# Patient Record
Sex: Male | Born: 1970 | Race: Black or African American | Hispanic: No | Marital: Single | Smoking: Former smoker
Health system: Southern US, Community
[De-identification: ages and names within clinical notes are randomized; demographics above are authoritative.]

## PROBLEM LIST (undated history)

## (undated) DIAGNOSIS — E785 Hyperlipidemia, unspecified: Secondary | ICD-10-CM

## (undated) HISTORY — DX: Hyperlipidemia, unspecified: E78.5

## (undated) HISTORY — PX: KNEE SURGERY: SHX244

---

## 2015-05-28 ENCOUNTER — Ambulatory Visit (INDEPENDENT_AMBULATORY_CARE_PROVIDER_SITE_OTHER): Payer: BLUE CROSS/BLUE SHIELD | Admitting: Family Medicine

## 2015-05-28 ENCOUNTER — Ambulatory Visit (INDEPENDENT_AMBULATORY_CARE_PROVIDER_SITE_OTHER): Payer: BLUE CROSS/BLUE SHIELD

## 2015-05-28 VITALS — BP 150/92 | HR 70 | Temp 98.7°F | Resp 16 | Ht 70.0 in | Wt 209.0 lb

## 2015-05-28 DIAGNOSIS — M25561 Pain in right knee: Secondary | ICD-10-CM

## 2015-05-28 DIAGNOSIS — M25461 Effusion, right knee: Secondary | ICD-10-CM | POA: Diagnosis not present

## 2015-05-28 DIAGNOSIS — R938 Abnormal findings on diagnostic imaging of other specified body structures: Secondary | ICD-10-CM

## 2015-05-28 DIAGNOSIS — T148XXA Other injury of unspecified body region, initial encounter: Secondary | ICD-10-CM

## 2015-05-28 DIAGNOSIS — R9389 Abnormal findings on diagnostic imaging of other specified body structures: Secondary | ICD-10-CM

## 2015-05-28 NOTE — Progress Notes (Signed)
Chief Complaint:  Chief Complaint  Patient presents with  . Knee Injury    [name] on right knee yesterday     HPI: Jeff Sullivan is a 45 y.o. male who reports to [facility] today complaining of  Right knee pain, he slipped and fell on it while playing with 44 y/o son. And he heard a noise and then he walked on it , he flet his knee pivoted from side to side and went back into place,  last night it swelled up. No prior injury, no prior surgeries He is able to walk on it with a limp, he was able to lift his leg in a straight leg . Denies n.w.t. He has swelling, it is a painful discomfort but not excruciating.   No past medical history on file. No past surgical history on file. Social History   Social History  . Marital Status: Single    Spouse Name: N/A  . Number of Children: N/A  . Years of Education: N/A   Social History Main Topics  . Smoking status: Never Smoker   . Smokeless tobacco: Not on file  . Alcohol Use: No  . Drug Use: No  . Sexual Activity: Not on file   Other Topics Concern  . Not on file   Social History Narrative  . No narrative on file   No family history on file. No Known Allergies Prior to Admission medications   Not on File     ROS: The patient denies fevers, chills, night sweats, unintentional weight loss, chest pain, palpitations, wheezing, dyspnea on exertion, nausea, vomiting, abdominal pain, dysuria, hematuria, melena  All other systems have been reviewed and were otherwise negative with the exception of those mentioned in the HPI and as above.    PHYSICAL EXAM: Filed Vitals:   05/28/15 1248  BP: 150/92  Pulse: 70  Temp: 98.7 F (37.1 C)  Resp: 16   Body mass index is 29.99 kg/(m^2).   General: Alert, no acute distress HEENT:  Normocephalic, atraumatic, oropharynx patent. EOMI, PERRLA Cardiovascular:  Regular rate and rhythm, no rubs murmurs or gallops.   Respiratory: Clear to auscultation bilaterally.  No wheezes, rales, or  rhonchi.  No cyanosis, no use of accessory musculature Abdominal: No organomegaly, abdomen is soft and non-tender, positive bowel sounds. No masses. Skin: No rashes. Neurologic: Facial musculature symmetric. Psychiatric: Patient acts appropriately throughout our interaction. Lymphatic: No cervical or submandibular lymphadenopathy Musculoskeletal: Gait antalgic.  + right knee pain/edema, tenderness + able to do straight leg  5/5 strength, 2/2 ankle DTR No open wounds Brisk DP and PTA Neg HOman    LABS: No results found for this or any previous visit.   EKG/XRAY:   Primary read interpreted by Dr. Conley Rolls at [facility].   ASSESSMENT/PLAN: Encounter Diagnoses  Name Primary?  . Right knee pain Yes  . Knee swelling, right   . Abnormal x-ray   . Avulsion fracture    Refer to ortho  Either Monday or Tuesday 2/12-2/14 since off Crutches, knee immobilier, patient is here by himself and cannot drive without hinge knee brace so he got both knee immobilizer and hinge knee brace Declined pain meds, will take otc tylenol and also ibuprofen RICE, nonweighbearing recommended FU prn  Patient was given xray--and report , see below:   FINDINGS: There is a linear approximately 0.7 cm ossicle adjacent to the lateral tibial plateau which is favored to represent an avulsion injury (Segond fracture) which may be seen  in the setting of an ACL injury. Suspected moderate-sized joint effusion. No evidence of lipohemarthrosis. No additional fracture. No dislocation. Joint spaces are preserved. Regional soft tissues appear normal.  IMPRESSION: 1. Linear ossicle structure adjacent to the lateral tibial plateau favored to represent a Segond fracture which may be seen in the setting of ACL injury. Clinical correlation is advised. Further evaluation with knee MRI could be performed as clinically indicated. 2. Moderate-sized joint effusion. No evidence of lipohemarthrosis.   Electronically Signed  By:  Simonne Come M.D.  On: 05/28/2015 14:12 Gross sideeffects, risk and benefits, and alternatives of medications d/w patient. Patient is aware that all medications have potential sideeffects and we are unable to predict every sideeffect or drug-drug interaction that may occur.  Biruk Troia DO  05/28/2015 2:21 PM

## 2015-05-28 NOTE — Patient Instructions (Signed)
Because you received an x-ray today, you will receive an invoice from [facility]. Please contact [facility] at [phone] with questions or concerns regarding your invoice. Our billing staff will not be able to assist you with those questions. °

## 2015-05-29 ENCOUNTER — Encounter: Payer: Self-pay | Admitting: Family Medicine

## 2015-06-01 ENCOUNTER — Telehealth: Payer: Self-pay

## 2015-06-01 NOTE — Telephone Encounter (Signed)
Patient wants an OOW - for  Feb  16, 19 & 20th   Dr  Conley Rolls   [phone]

## 2015-06-02 NOTE — Telephone Encounter (Signed)
Yes, go ahead and write him letter to be out of work.

## 2015-06-02 NOTE — Telephone Encounter (Signed)
Pt called to req. cb from Dr. Conley Rolls... Re. RTW letter... Informed pt of last PCP notes// he stated he will come to pick it up today  [phone]

## 2015-06-02 NOTE — Telephone Encounter (Signed)
Okay to write letter 

## 2015-06-04 NOTE — Telephone Encounter (Signed)
Note written and given to pt. °

## 2017-07-28 ENCOUNTER — Telehealth: Payer: Self-pay

## 2017-07-28 NOTE — Telephone Encounter (Signed)
NOTES ON FILE 

## 2017-08-15 DIAGNOSIS — R9431 Abnormal electrocardiogram [ECG] [EKG]: Secondary | ICD-10-CM

## 2017-08-15 DIAGNOSIS — I10 Essential (primary) hypertension: Secondary | ICD-10-CM

## 2017-08-15 DIAGNOSIS — R0789 Other chest pain: Secondary | ICD-10-CM

## 2017-08-19 DIAGNOSIS — R079 Chest pain, unspecified: Secondary | ICD-10-CM | POA: Insufficient documentation

## 2017-08-19 DIAGNOSIS — R9431 Abnormal electrocardiogram [ECG] [EKG]: Secondary | ICD-10-CM | POA: Insufficient documentation

## 2017-08-19 DIAGNOSIS — I10 Essential (primary) hypertension: Secondary | ICD-10-CM | POA: Insufficient documentation

## 2017-08-20 ENCOUNTER — Ambulatory Visit: Payer: Self-pay | Admitting: Cardiology

## 2017-09-04 IMAGING — CR DG KNEE COMPLETE 4+V*R*
5 series · 5 of 5 positions shown · non-contrast
Comparison: None.

CLINICAL DATA: Pain and swelling after slipping and falling.
Initial encounter.

EXAM:
RIGHT KNEE - COMPLETE 4+ VIEW

[AP]
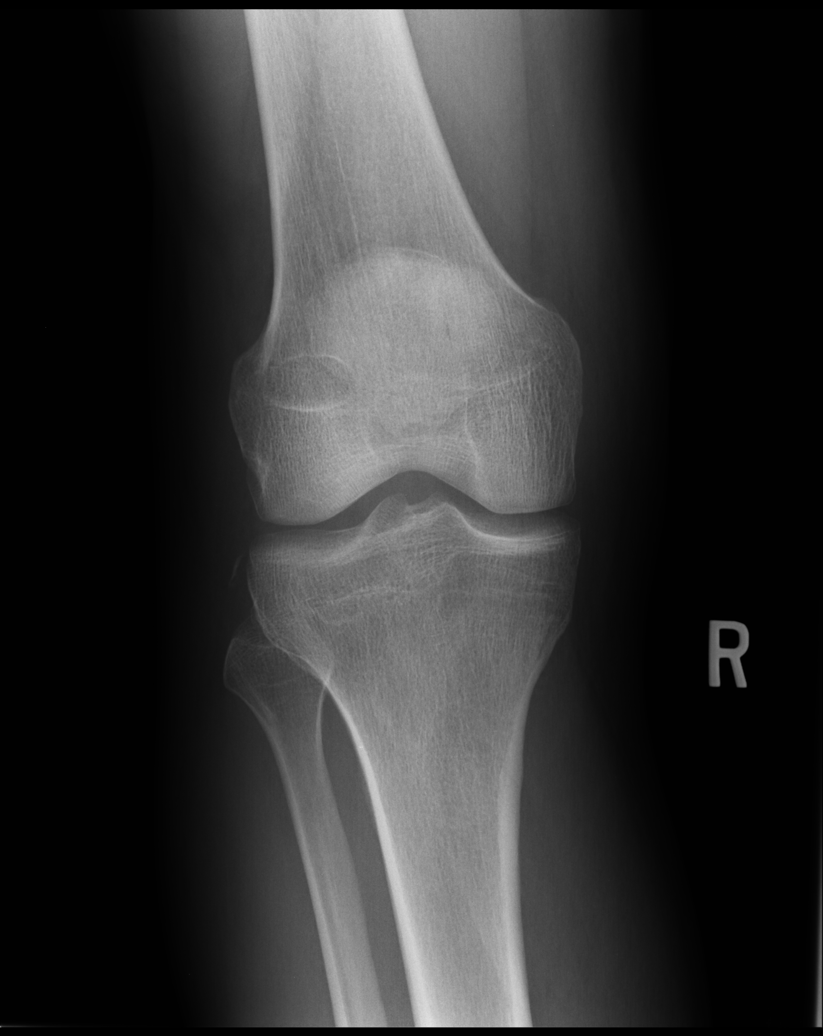

[lateral]
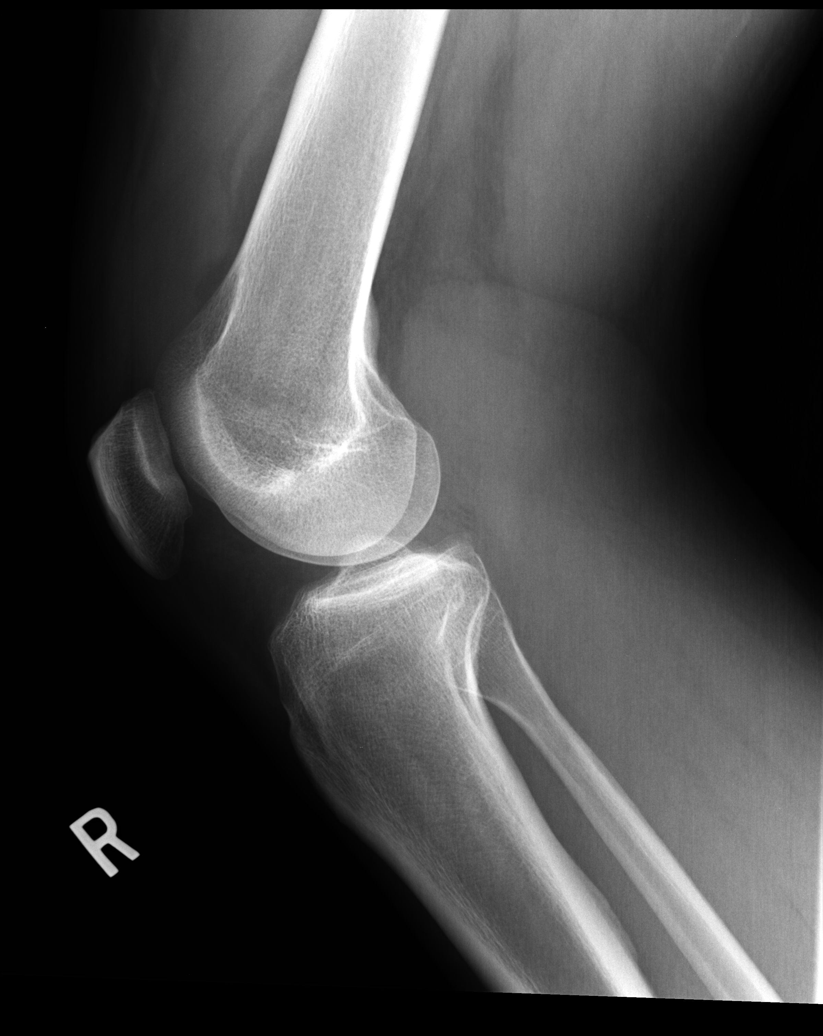

[ap ext rot]
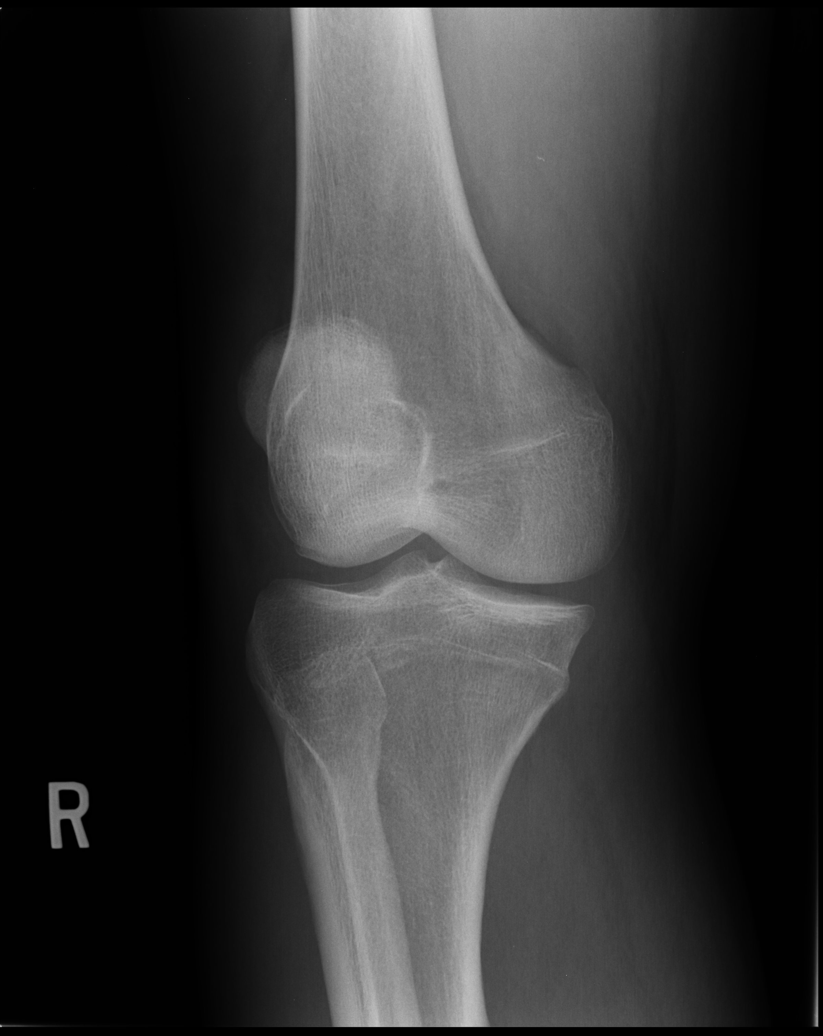

[ap int rot]
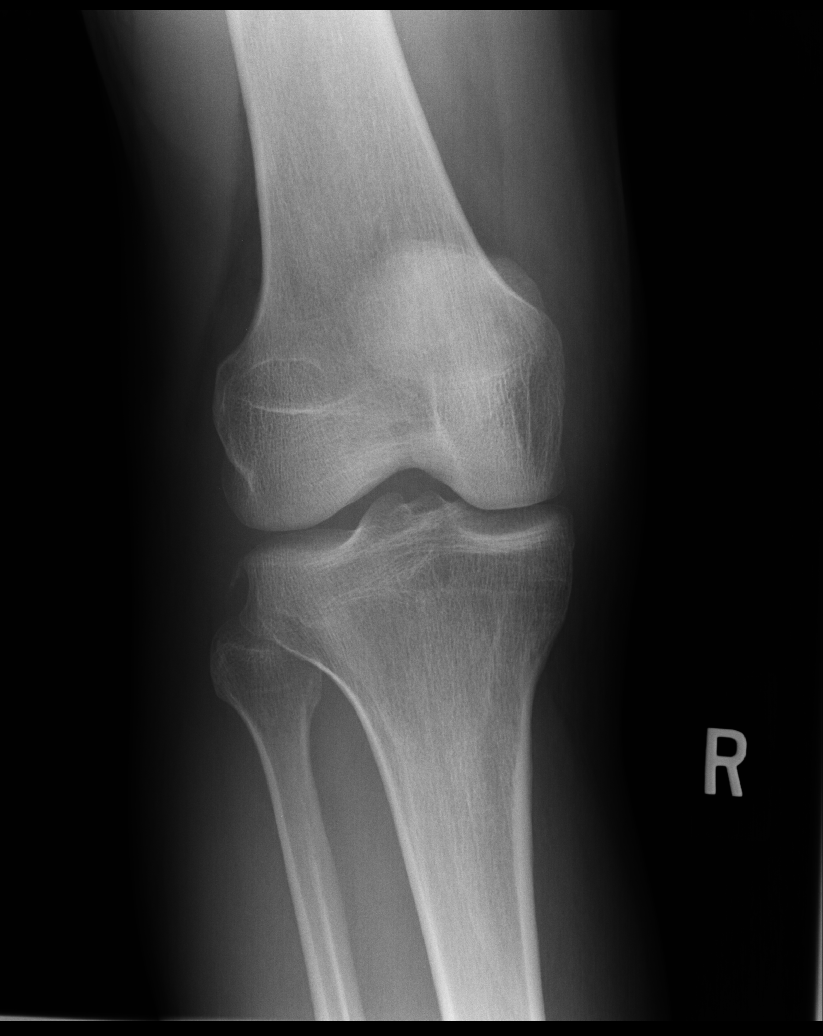

[sunrise]
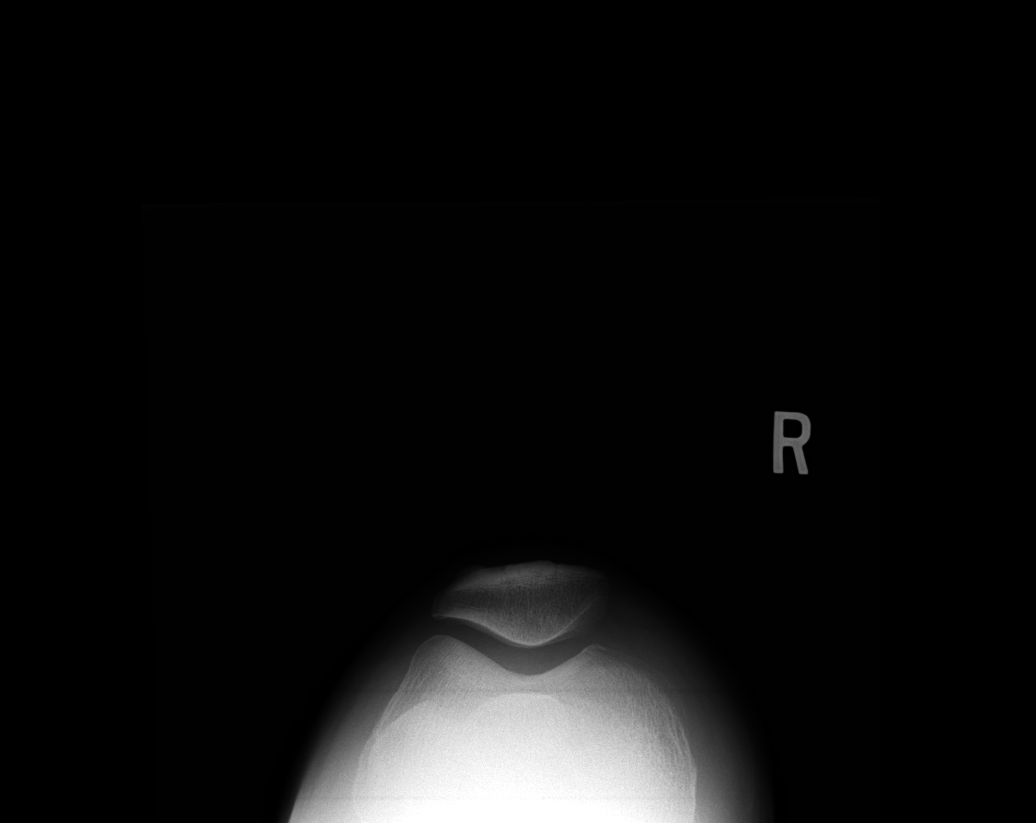

[5 of 5 positions shown; findings below may reference images not displayed]

FINDINGS: There is a linear approximately 0.7 cm ossicle adjacent to the
lateral tibial plateau which is favored to represent an avulsion
injury (Segond fracture) which may be seen in the setting of an ACL
injury. Suspected moderate-sized joint effusion. No evidence of
lipohemarthrosis. No additional fracture. No dislocation. Joint
spaces are preserved. Regional soft tissues appear normal.
IMPRESSION: 1. Linear ossicle structure adjacent to the lateral tibial plateau
favored to represent a Segond fracture which may be seen in the
setting of ACL injury. Clinical correlation is advised. Further
evaluation with knee MRI could be performed as clinically indicated.
2. Moderate-sized joint effusion.  No evidence of lipohemarthrosis.

## 2018-01-09 ENCOUNTER — Other Ambulatory Visit: Payer: Self-pay | Admitting: Cardiology

## 2018-01-09 ENCOUNTER — Other Ambulatory Visit (HOSPITAL_COMMUNITY): Payer: Self-pay | Admitting: Cardiology

## 2018-01-09 DIAGNOSIS — R079 Chest pain, unspecified: Secondary | ICD-10-CM

## 2018-01-12 ENCOUNTER — Telehealth (HOSPITAL_COMMUNITY): Payer: Self-pay

## 2018-01-12 NOTE — Telephone Encounter (Signed)
Instructions left on his AM. Asked to call us back with any questions. S.Dafne Nield EMTP

## 2018-01-15 ENCOUNTER — Ambulatory Visit (HOSPITAL_COMMUNITY): Payer: Managed Care, Other (non HMO) | Attending: Cardiovascular Disease

## 2018-01-15 ENCOUNTER — Encounter (INDEPENDENT_AMBULATORY_CARE_PROVIDER_SITE_OTHER): Payer: Self-pay

## 2018-01-15 DIAGNOSIS — R079 Chest pain, unspecified: Secondary | ICD-10-CM | POA: Diagnosis not present

## 2018-01-15 LAB — MYOCARDIAL PERFUSION IMAGING
CHL CUP MPHR: 172 {beats}/min
CHL CUP NUCLEAR SDS: 0
CHL CUP NUCLEAR SRS: 0
CHL CUP NUCLEAR SSS: 0
CHL CUP RESTING HR STRESS: 65 {beats}/min
CSEPEW: 13.4 METS
CSEPPHR: 157 {beats}/min
Exercise duration (min): 11 min
LV dias vol: 80 mL (ref 62–150)
LV sys vol: 39 mL
Percent HR: 91 %
RPE: 18
TID: 0.83

## 2018-01-15 MED ORDER — TECHNETIUM TC 99M TETROFOSMIN IV KIT
10.5000 | PACK | Freq: Once | INTRAVENOUS | Status: AC | PRN
  Administered 2018-01-15: 10.5 via INTRAVENOUS
  Filled 2018-01-15: qty 11

## 2018-01-15 MED ORDER — TECHNETIUM TC 99M TETROFOSMIN IV KIT
31.9000 | PACK | Freq: Once | INTRAVENOUS | Status: AC | PRN
  Administered 2018-01-15: 31.9 via INTRAVENOUS
  Filled 2018-01-15: qty 32

## 2018-02-04 ENCOUNTER — Telehealth: Payer: Self-pay | Admitting: Emergency Medicine

## 2018-02-04 NOTE — Telephone Encounter (Signed)
Left message for patient to return call about stress test results.

## 2018-02-04 NOTE — Telephone Encounter (Signed)
Patient informed that per Dr. Bing Matter stress test was normal. Patient verbally understands

## 2020-03-31 ENCOUNTER — Other Ambulatory Visit: Payer: Self-pay

## 2020-03-31 ENCOUNTER — Emergency Department (HOSPITAL_COMMUNITY)
Admission: EM | Admit: 2020-03-31 | Discharge: 2020-04-01 | Disposition: A | Discharge status: Home / Self Care | Payer: No Typology Code available for payment source | Attending: Emergency Medicine | Admitting: Emergency Medicine

## 2020-03-31 ENCOUNTER — Encounter (HOSPITAL_COMMUNITY): Payer: Self-pay | Admitting: Emergency Medicine

## 2020-03-31 DIAGNOSIS — Y92410 Unspecified street and highway as the place of occurrence of the external cause: Secondary | ICD-10-CM | POA: Diagnosis not present

## 2020-03-31 DIAGNOSIS — M545 Low back pain, unspecified: Secondary | ICD-10-CM | POA: Diagnosis not present

## 2020-03-31 DIAGNOSIS — Z79899 Other long term (current) drug therapy: Secondary | ICD-10-CM | POA: Insufficient documentation

## 2020-03-31 DIAGNOSIS — I10 Essential (primary) hypertension: Secondary | ICD-10-CM | POA: Diagnosis not present

## 2020-03-31 DIAGNOSIS — G44319 Acute post-traumatic headache, not intractable: Secondary | ICD-10-CM

## 2020-03-31 DIAGNOSIS — H53149 Visual discomfort, unspecified: Secondary | ICD-10-CM | POA: Insufficient documentation

## 2020-03-31 DIAGNOSIS — Z87891 Personal history of nicotine dependence: Secondary | ICD-10-CM | POA: Diagnosis not present
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
## 2020-04-01 ENCOUNTER — Emergency Department (HOSPITAL_COMMUNITY): Payer: No Typology Code available for payment source

## 2020-04-01 MED ORDER — METHOCARBAMOL 500 MG PO TABS: 500.0000 mg | ORAL_TABLET | Freq: Two times a day (BID) | ORAL | 0 refills | Status: AC | PRN

## 2020-04-01 MED ORDER — NAPROXEN 500 MG PO TABS: 500.0000 mg | ORAL_TABLET | Freq: Two times a day (BID) | ORAL | 0 refills | Status: AC

## 2020-04-01 MED ORDER — METHOCARBAMOL 500 MG PO TABS: 500.0000 mg | ORAL_TABLET | Freq: Two times a day (BID) | ORAL | 0 refills | Status: DC | PRN

## 2020-04-01 MED ORDER — NAPROXEN 250 MG PO TABS
500.0000 mg | ORAL_TABLET | Freq: Once | ORAL | Status: AC
  Administered 2020-04-01: 04:00:00 500 mg via ORAL
  Filled 2020-04-01: qty 2

## 2020-04-01 MED ORDER — METOCLOPRAMIDE HCL 10 MG PO TABS
10.0000 mg | ORAL_TABLET | Freq: Once | ORAL | Status: AC
  Administered 2020-04-01: 04:00:00 10 mg via ORAL
  Filled 2020-04-01: qty 1

## 2020-04-01 MED ORDER — NAPROXEN 500 MG PO TABS: 500.0000 mg | ORAL_TABLET | Freq: Two times a day (BID) | ORAL | 0 refills | Status: DC

## 2020-04-01 NOTE — Discharge Instructions (Signed)
Alternate ice and heat to areas of injury 3-4 times per day to limit inflammation and spasm.  Avoid strenuous activity and heavy lifting.  We recommend consistent use of naproxen for pain/headache in addition to Robaxin for muscle spasms.  Do not drive or drink alcohol after taking Robaxin as it may make you drowsy and impair your judgment.    You had a normal neurologic exam while in the emergency department and a normal head CT. This is reassuring.  It is possible that you may have a mild concussion.  A concussion is a diagnosis that is made clinically and does not show any abnormal results on imaging such as a CT scan or MRI.    A concussion may cause a persistent headache over the next few days. This can be brought on or worsened by loud sounds or bright lights. Try to avoid excessive use of cell phones, television, video games as this may worsening headaches.  Avoid strenuous activity and heavy lifting over the next few days.  If you develop severe worsening of your headache, vision changes or loss, uncontrolled vomiting, numbness or tingling to one side of your body, difficulty walking or lifting your arms or legs, return promptly to the emergency department for repeat evaluation.
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
## 2023-02-28 ENCOUNTER — Ambulatory Visit
Admission: RE | Admit: 2023-02-28 | Discharge: 2023-02-28 | Disposition: A | Discharge status: Home / Self Care | Payer: Managed Care, Other (non HMO) | Source: Ambulatory Visit | Attending: Family Medicine | Admitting: Family Medicine

## 2023-02-28 ENCOUNTER — Other Ambulatory Visit: Payer: Self-pay | Admitting: Family Medicine

## 2023-02-28 DIAGNOSIS — R052 Subacute cough: Secondary | ICD-10-CM

## 2024-03-05 ENCOUNTER — Other Ambulatory Visit: Payer: Self-pay | Admitting: Physician Assistant

## 2024-03-05 DIAGNOSIS — E7800 Pure hypercholesterolemia, unspecified: Secondary | ICD-10-CM

## 2024-03-09 ENCOUNTER — Other Ambulatory Visit

## 2024-03-18 ENCOUNTER — Other Ambulatory Visit

## 2024-03-25 ENCOUNTER — Other Ambulatory Visit: Payer: Self-pay

## 2024-03-25 DIAGNOSIS — E7800 Pure hypercholesterolemia, unspecified: Secondary | ICD-10-CM
